# Patient Record
Sex: Female | Born: 1950 | Race: Black or African American | Hispanic: No | Marital: Married | Smoking: Former smoker
Health system: Southern US, Community
[De-identification: ages and names within clinical notes are randomized; demographics above are authoritative.]

## PROBLEM LIST (undated history)

## (undated) DIAGNOSIS — C679 Malignant neoplasm of bladder, unspecified: Secondary | ICD-10-CM

## (undated) DIAGNOSIS — C50919 Malignant neoplasm of unspecified site of unspecified female breast: Secondary | ICD-10-CM

## (undated) HISTORY — PX: BLADDER SURGERY: SHX569

## (undated) HISTORY — PX: REPLACEMENT TOTAL KNEE: SUR1224

---

## 2005-06-15 ENCOUNTER — Ambulatory Visit (HOSPITAL_COMMUNITY): Admission: RE | Admit: 2005-06-15 | Discharge: 2005-06-15 | Payer: Self-pay | Admitting: Internal Medicine

## 2007-12-11 ENCOUNTER — Encounter: Admission: RE | Admit: 2007-12-11 | Discharge: 2007-12-11 | Payer: Self-pay | Admitting: Internal Medicine

## 2007-12-18 ENCOUNTER — Encounter: Admission: RE | Admit: 2007-12-18 | Discharge: 2007-12-18 | Payer: Self-pay | Admitting: Obstetrics and Gynecology

## 2008-01-02 ENCOUNTER — Inpatient Hospital Stay (HOSPITAL_COMMUNITY): Admission: EM | Admit: 2008-01-02 | Discharge: 2008-01-03 | Payer: Self-pay | Admitting: Emergency Medicine

## 2008-01-02 ENCOUNTER — Ambulatory Visit: Payer: Self-pay | Admitting: Internal Medicine

## 2009-03-17 IMAGING — CR DG CHEST 1V PORT
2 series · 2 of 2 positions shown · non-contrast
Comparison: None

CLINICAL DATA: Chest pain, right arm pain

PORTABLE CHEST - 1 VIEW

[w chest pa]
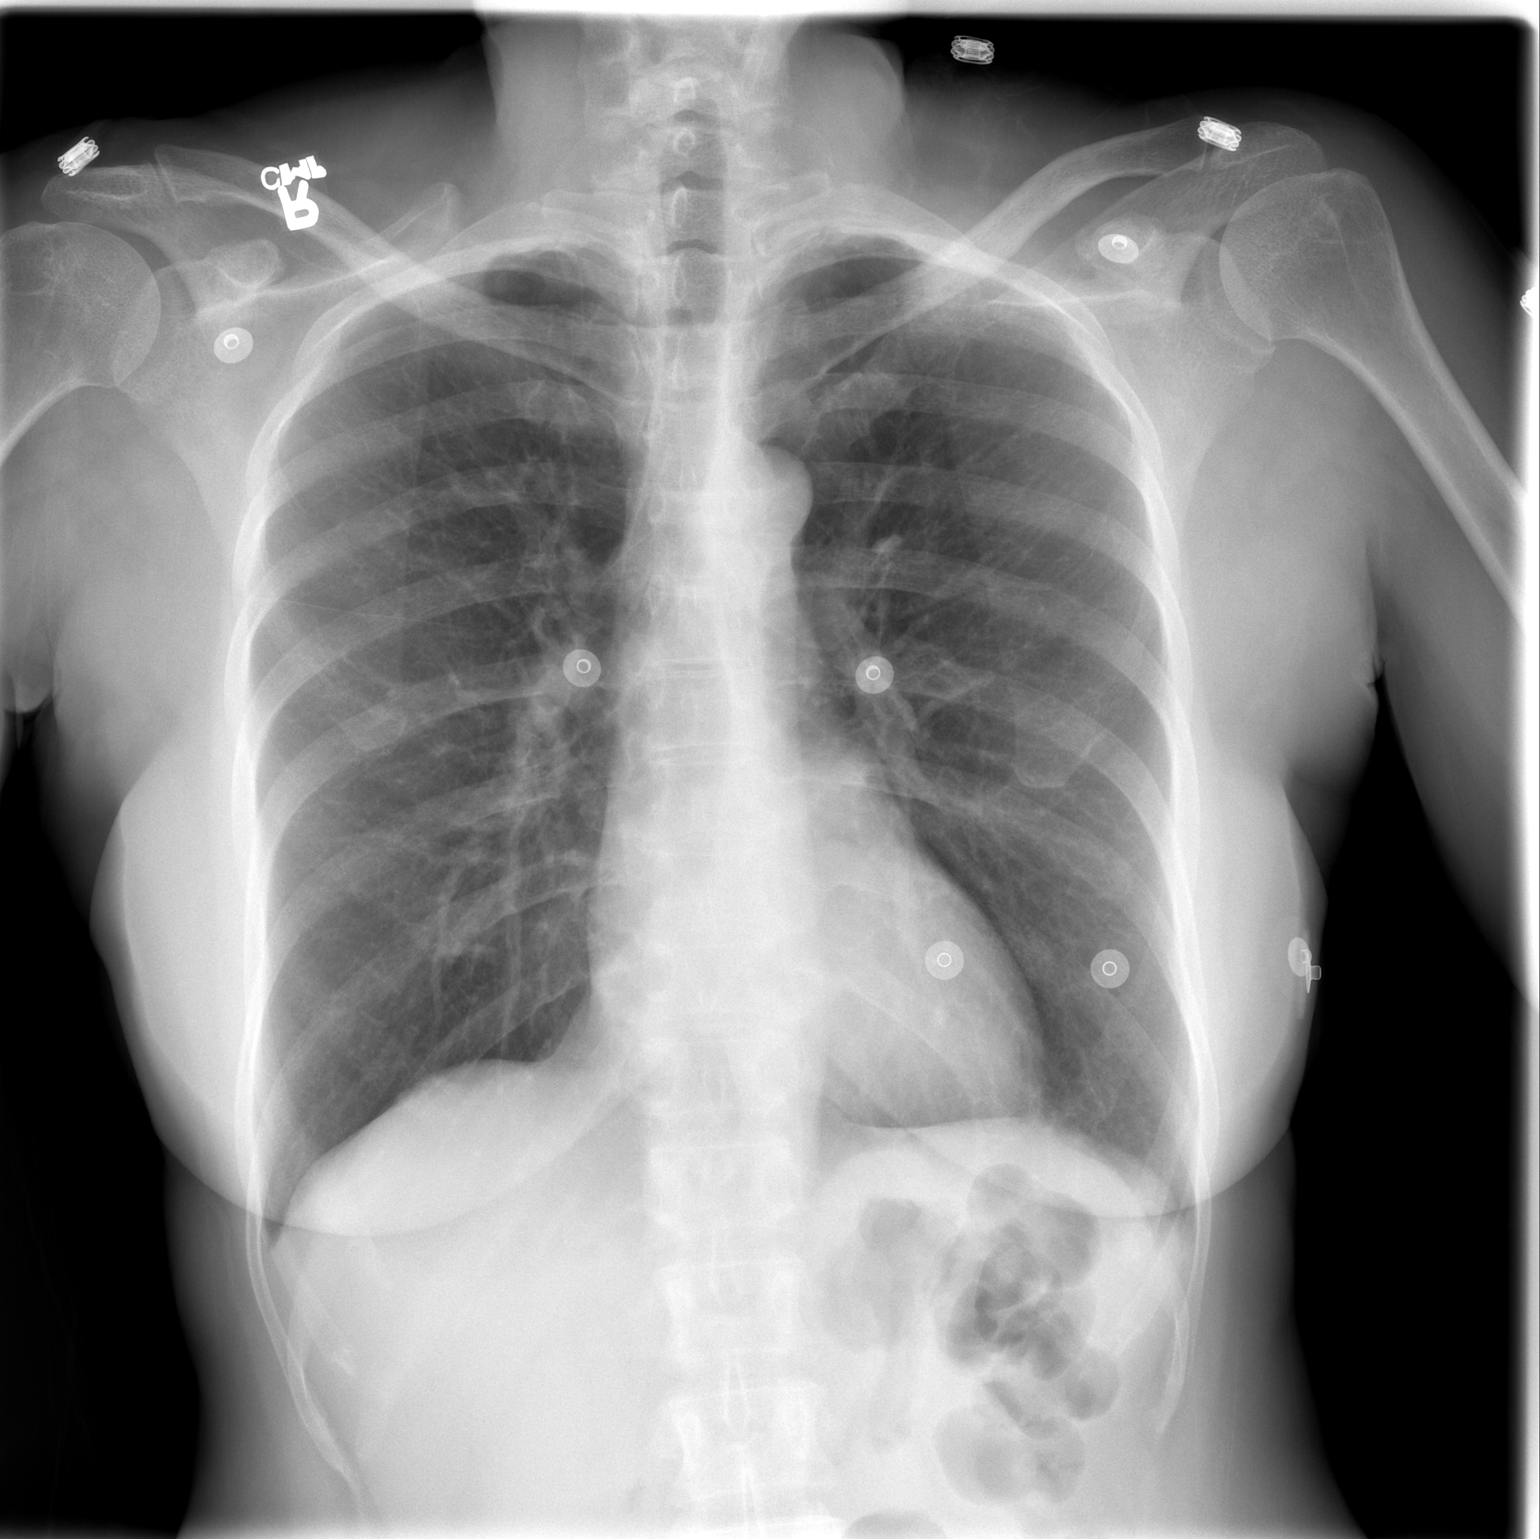

[w chest lat]
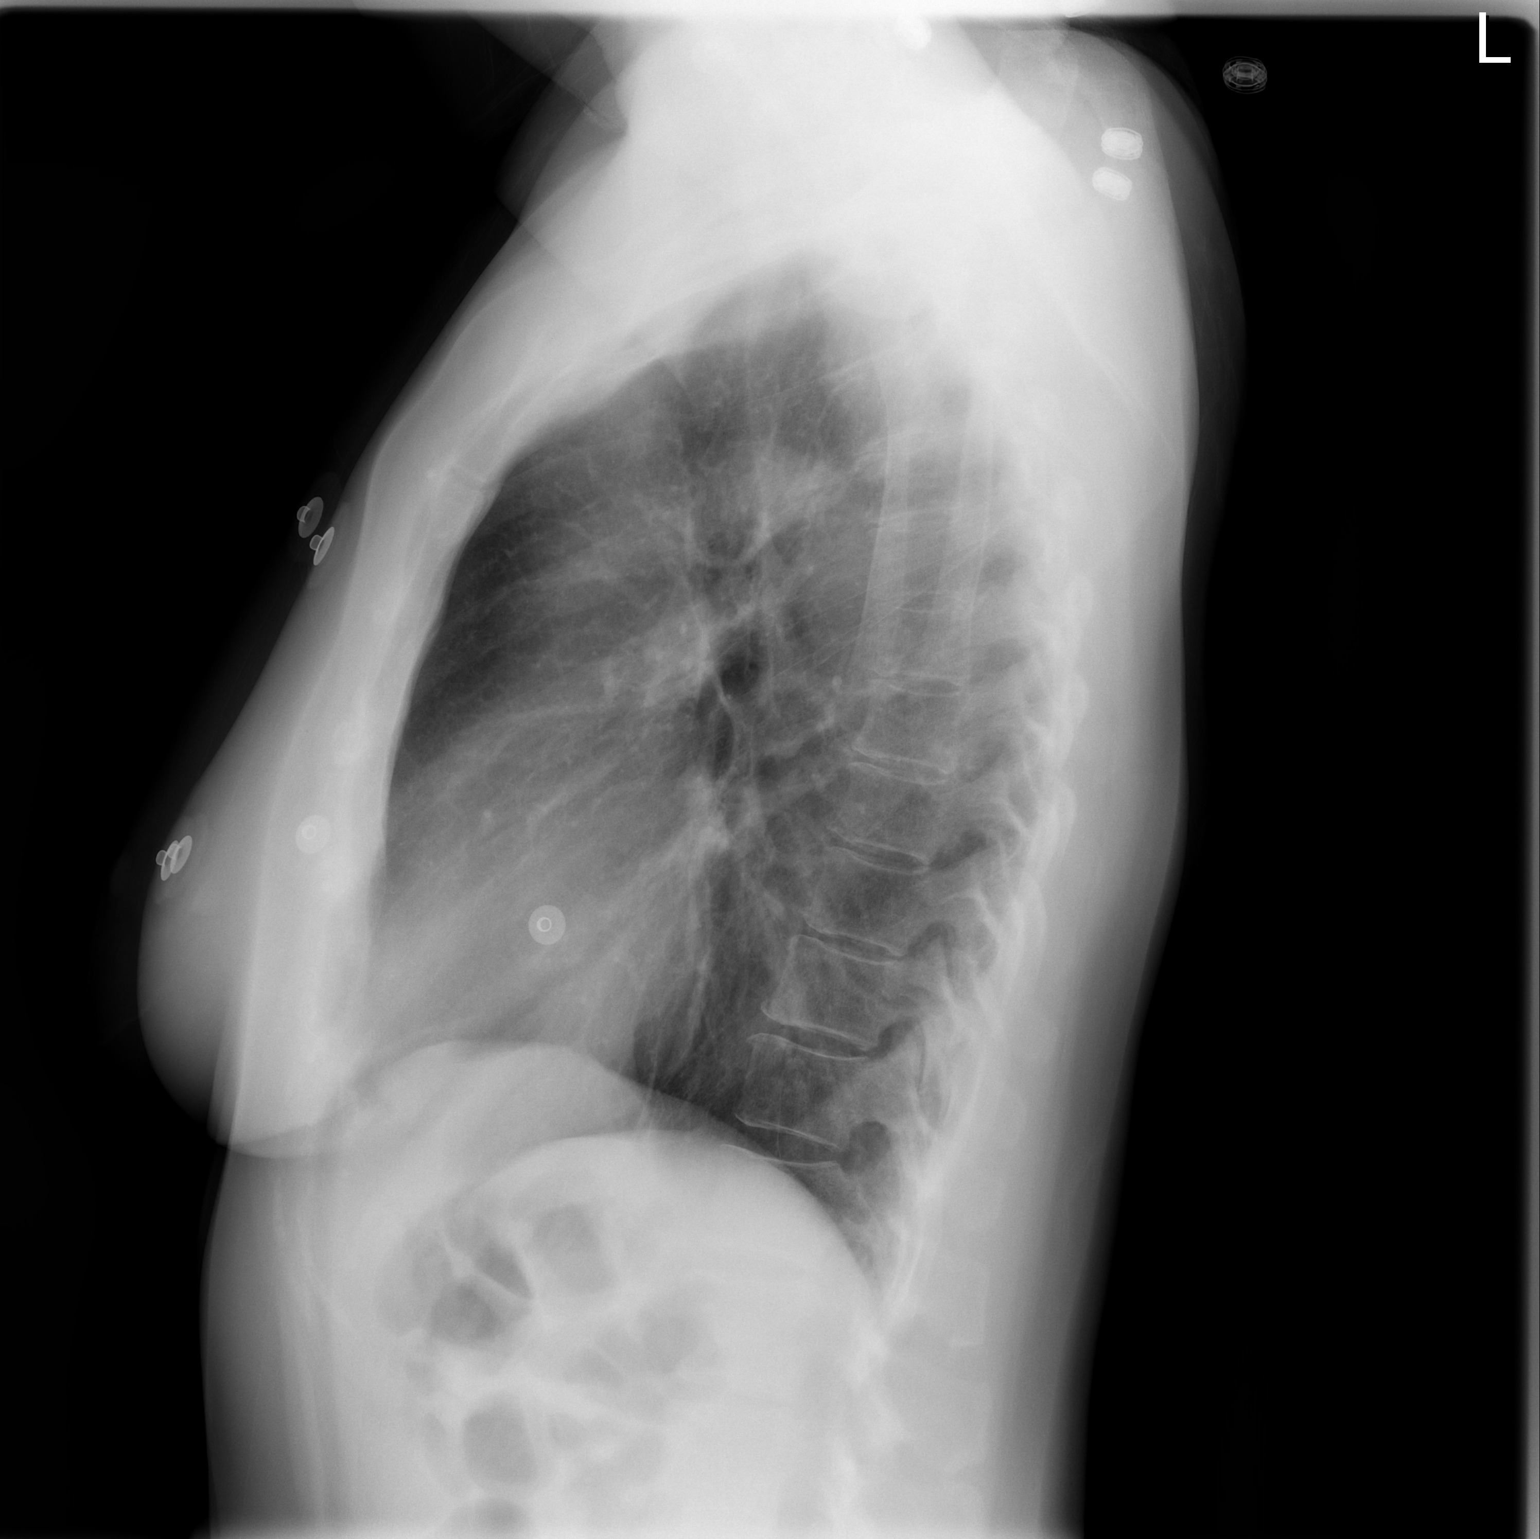

[2 of 2 positions shown; findings below may reference images not displayed]

FINDINGS: Normal mediastinum and cardiac silhouette.  Costophrenic
angles are clear.  No evidence effusion, infiltrate, or
pneumothorax.
IMPRESSION: No acute cardiopulmonary process.

## 2010-11-20 NOTE — Discharge Summary (Signed)
NAMEBrekyn, Abigail Perkins Camreigh        ACCOUNT NO.:  [id number]   MEDICAL RECORD NO.:  [id number]          PATIENT TYPE:  INP   LOCATION:  2001                         FACILITY:  [facility]   PHYSICIAN:  Theressa Millard, M.D.    DATE OF BIRTH:  [birth year]   DATE OF ADMISSION:  01/01/2008  DATE OF DISCHARGE:  01/03/2008                               DISCHARGE SUMMARY   ADMITTING DIAGNOSIS:  Chest pain.   DISCHARGE DIAGNOSES:  1. Chest pain, myocardial infarction ruled out, no evidence of      coronary disease on stress Cardiolite.  2. Anxiety.  3. Right arm tingling, resolved.   The patient is a 60 year old African American female who has had  intermittent chest discomfort for the last couple of weeks.  She finally  came to the emergency room.   HOSPITAL COURSE:  The patient was admitted on the basis of serial  enzymes and EKGs and myocardial infarction was ruled out.  She underwent  a stress Cardiolite with findings of no evidence of inducible ischemia.  She had initially been started on statins, nitrates, and beta blocker.  These were discontinued when her stress Cardiolite was negative.  She  was started on Protonix.  She felt like she felt somewhat better and she  was relieved to discover that this was not her heart.  She was  discharged in improved condition.   DISCHARGE MEDICATIONS:  1. Vitamin D 50,000 units once weekly.  2. Caltrate daily.  3. Aspirin 81 mg daily.  4. Ambien 5 mg at bedtime p.r.n.  5. Vitamin D 1000 units daily.  6. Omeprazole 20 mg daily for the next 30 days.   FOLLOWUP:  She will call to make an appointment to see Dr. Nehemiah Settle in 3-4  weeks.   ACTIVITY:  No limitation.   DIET:  No limitation.      Theressa Millard, M.D.  Electronically Signed     JO/MEDQ  D:  01/03/2008  T:  01/03/2008  Job:  [id number]

## 2010-11-20 NOTE — H&P (Signed)
NAMEMarcayla, Budge Mallorie        ACCOUNT NO.:  [id number]   MEDICAL RECORD NO.:  [id number]          PATIENT TYPE:  INP   LOCATION:  2001                         FACILITY:  [facility]   PHYSICIAN:  Donalynn Furlong, MD      DATE OF BIRTH:  Feb 19, 1951   DATE OF ADMISSION:  01/01/2008  DATE OF DISCHARGE:                              HISTORY & PHYSICAL   CHIEF COMPLAINT:  Recurrent chest pain.   HISTORY OF PRESENT ILLNESS:  Abigail Perkins is a 60 year old  African American female who lives with her husband.  She presented to  the ER tonight with the complaint of intermittent chest pain for the  last 2 weeks.  The chest pain comes and goes and lasts for about 2-3  minutes every time.  The severe episodes last a little longer, maybe 3-4  minutes.  The chest pain is usually located on the left side of the  chest or the precordial area.  One episode did start in the back of her  chest and then came on the front of her chest.  Otherwise, no radiation  of chest pain.  No aggravating or alleviating factors.  The chest pain  does not get worse with cough, deep breathing or chest movement.  There  is no palpable chest tenderness on the chest wall either.  She denied  any history of coronary artery disease, pneumonia, clots in the lungs,  gastroesophageal reflux disease, gallstones or appendix problem in the  past.  She denied any sick contacts.  She works a 14 hour job, which  makes her a life a little more stressful, and she thinks the chest pain  may be due to her anxiety related to the work.  She denied any history  of smoking, alcohol use or drug use.  She had a stress test, cardiac  cath and echocardiogram around 15 years which was unremarkable.  She did  have an event monitor for 30 days at the same time, but it was  nondiagnostic.  The patient mentioned that her chest pain is pressure-  like at the same spot every time.  She took aspirin one time with a  severe episode last episode which  relieved her pain.  On encounter, she  also mentioned while laying in the bed in the ER, she experienced 2-3  episodes lasting 2-3 minutes also.   PAST MEDICAL HISTORY:  Vitamin deficiency.   ALLERGIES:  NONE.   HOME MEDICATIONS:  Multivitamin tablets.   FAMILY HISTORY:  Coronary artery disease in mother in late 68s and early  63s.   SOCIAL HISTORY:  She lives with her husband.  No alcohol, drug or  tobacco use.   REVIEW OF SYSTEMS:  Positive as per HPI, otherwise negative.  Review of  systems done for 14 systems.   PHYSICAL EXAMINATION:  VITAL SIGNS:  Blood pressure 142/98, pulse 63,  respirations 20, temperature 97.8.  Oxygen saturation 99% on room air.  GENERAL:  Alert and oriented x3, laying in bed, not in acute distress.  CARDIOVASCULAR:  S1-S2 regular.  No murmur.  CHEST:  Clear to auscultation bilaterally.  No wheezing, no  crackles.  ABDOMEN:  Nontender, nondistended.  Bowel sounds present.  EXTREMITIES:  No clubbing, cyanosis or edema.  Pulses palpable in all  four extremities.  SKIN:  No rash or bruise.  NEUROLOGIC:  Shows intact cranial nerves, muscular strength and  sensation.  HEAD:  Normocephalic, nontraumatic.  EYES:  Pupils equally reactive to light and accommodation.  Extraocular  movements intact.  ORAL CAVITY:  Oral mucosa moist. No thrush noted.  NECK:  No thyromegaly or JVD.   LABORATORY DATA:  Shows two sets of cardiac enzymes in which troponins  are negative.  EKG shows normal sinus rhythm with heart rate of 59.  Axes are normal.  P/QRS interval and ST-T changes are unremarkable.  Chest x-ray portable view unremarkable.  Chemistry-8 panel is  unremarkable also.   ASSESSMENT/PLAN:  1. Atypical chest pain could be due to anxiety vs Coronary artery      disease vs gastroesophageal reflux disease   Patient with atypical chest pain, recurrent episodes, may be related to  anxiety versus heart disease versus gastroesophageal reflux disease.  We  will get  CBC with CMP and TSH  in the morning.  Will get EKG also.  Will  get one more set of cardiac enzymes with troponin to rule out myocardial  infarction.  After ruling out myocardial infarction, will get exercise  Cardiolite stress test in the morning to be read by [facility].  Will also start her on aspirin 325 mg p.o. daily, metoprolol 25 mg p.o.  daily, lisinopril 5 mg p.o. daily, simvastatin 20 mg p.o. daily,  nitropaste 1 inch q.6 h., p.r.n. for chest pain along with sublingual  nitroglycerin and IV Nexium 40 mg daily, Lovenox 1 mg/kg subcutaneously  q.12 h.  Further plan according to the lab and workup pending.      Donalynn Furlong, MD  Electronically Signed     TVP/MEDQ  D:  01/02/2008  T:  01/02/2008  Job:  [id number]   cc:   Deirdre Peer. Polite, M.D.

## 2011-04-04 LAB — POCT I-STAT, CHEM 8
Calcium, Ion: 1.14
Chloride: 106
HCT: 39
Hemoglobin: 13.3
Potassium: 3.5

## 2011-04-04 LAB — COMPREHENSIVE METABOLIC PANEL
ALT: 14
AST: 24
Albumin: 3.8
Alkaline Phosphatase: 86
Calcium: 9.7
GFR calc Af Amer: >60
Glucose, Bld: 90
Potassium: 3.3 — ABNORMAL LOW
Sodium: 142
Total Protein: 7.2

## 2011-04-04 LAB — CBC
Hemoglobin: 12.9
MCHC: 33
RDW: 13.7

## 2011-04-04 LAB — POCT CARDIAC MARKERS
CKMB, poc: 1.6
CKMB, poc: 1.8
Operator id: 192351
Troponin i, poc: <0.05
Troponin i, poc: <0.05

## 2011-04-04 LAB — CARDIAC PANEL(CRET KIN+CKTOT+MB+TROPI)
CK, MB: 1.6
Relative Index: 1.2
Total CK: 131

## 2014-10-07 ENCOUNTER — Other Ambulatory Visit: Payer: Self-pay | Admitting: Surgical Oncology

## 2014-10-07 DIAGNOSIS — R921 Mammographic calcification found on diagnostic imaging of breast: Secondary | ICD-10-CM

## 2014-10-14 ENCOUNTER — Other Ambulatory Visit: Payer: Self-pay | Admitting: Surgical Oncology

## 2014-10-14 ENCOUNTER — Ambulatory Visit
Admission: RE | Admit: 2014-10-14 | Discharge: 2014-10-14 | Disposition: A | Discharge status: Home / Self Care | Payer: Managed Care, Other (non HMO) | Source: Ambulatory Visit | Attending: Surgical Oncology | Admitting: Surgical Oncology

## 2014-10-14 DIAGNOSIS — R921 Mammographic calcification found on diagnostic imaging of breast: Secondary | ICD-10-CM

## 2014-10-17 ENCOUNTER — Ambulatory Visit
Admission: RE | Admit: 2014-10-17 | Discharge: 2014-10-17 | Disposition: A | Discharge status: Home / Self Care | Payer: Managed Care, Other (non HMO) | Source: Ambulatory Visit | Attending: Surgical Oncology | Admitting: Surgical Oncology

## 2014-10-17 DIAGNOSIS — R921 Mammographic calcification found on diagnostic imaging of breast: Secondary | ICD-10-CM

## 2021-12-12 ENCOUNTER — Emergency Department (HOSPITAL_BASED_OUTPATIENT_CLINIC_OR_DEPARTMENT_OTHER)
Admission: EM | Admit: 2021-12-12 | Discharge: 2021-12-12 | Disposition: A | Discharge status: Home / Self Care | Payer: No Typology Code available for payment source | Attending: Emergency Medicine | Admitting: Emergency Medicine

## 2021-12-12 ENCOUNTER — Encounter (HOSPITAL_BASED_OUTPATIENT_CLINIC_OR_DEPARTMENT_OTHER): Payer: Self-pay | Admitting: Emergency Medicine

## 2021-12-12 ENCOUNTER — Other Ambulatory Visit: Payer: Self-pay

## 2021-12-12 DIAGNOSIS — Y92410 Unspecified street and highway as the place of occurrence of the external cause: Secondary | ICD-10-CM | POA: Insufficient documentation

## 2021-12-12 DIAGNOSIS — S8002XA Contusion of left knee, initial encounter: Secondary | ICD-10-CM | POA: Insufficient documentation

## 2021-12-12 DIAGNOSIS — S8992XA Unspecified injury of left lower leg, initial encounter: Secondary | ICD-10-CM | POA: Diagnosis present

## 2021-12-12 DIAGNOSIS — S8010XA Contusion of unspecified lower leg, initial encounter: Secondary | ICD-10-CM

## 2021-12-12 HISTORY — DX: Malignant neoplasm of unspecified site of unspecified female breast: C50.919

## 2021-12-12 HISTORY — DX: Malignant neoplasm of bladder, unspecified: C67.9

## 2021-12-12 MED ORDER — ACETAMINOPHEN 325 MG PO TABS
650.0000 mg | ORAL_TABLET | Freq: Once | ORAL | Status: AC
  Administered 2021-12-12: 650 mg via ORAL
  Filled 2021-12-12: qty 2
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
